# Patient Record
Sex: Male | Born: 1990 | Race: White | Hispanic: No | Marital: Married | Smoking: Former smoker
Health system: Southern US, Academic
[De-identification: ages and names within clinical notes are randomized; demographics above are authoritative.]

## PROBLEM LIST (undated history)

## (undated) DIAGNOSIS — Z9989 Dependence on other enabling machines and devices: Secondary | ICD-10-CM

## (undated) DIAGNOSIS — E119 Type 2 diabetes mellitus without complications: Secondary | ICD-10-CM

## (undated) DIAGNOSIS — Z973 Presence of spectacles and contact lenses: Secondary | ICD-10-CM

## (undated) DIAGNOSIS — G4730 Sleep apnea, unspecified: Secondary | ICD-10-CM

## (undated) HISTORY — PX: HX WISDOM TEETH EXTRACTION: SHX21

---

## 2015-01-12 ENCOUNTER — Other Ambulatory Visit (HOSPITAL_COMMUNITY): Payer: Self-pay

## 2015-01-12 DIAGNOSIS — R29818 Other symptoms and signs involving the nervous system: Secondary | ICD-10-CM

## 2015-01-14 ENCOUNTER — Telehealth (HOSPITAL_BASED_OUTPATIENT_CLINIC_OR_DEPARTMENT_OTHER): Payer: Self-pay

## 2015-01-17 ENCOUNTER — Ambulatory Visit: Payer: Non-veteran care

## 2015-01-17 DIAGNOSIS — G4730 Sleep apnea, unspecified: Secondary | ICD-10-CM | POA: Insufficient documentation

## 2015-01-17 DIAGNOSIS — R29818 Other symptoms and signs involving the nervous system: Secondary | ICD-10-CM

## 2015-01-17 DIAGNOSIS — G4733 Obstructive sleep apnea (adult) (pediatric): Secondary | ICD-10-CM

## 2015-01-18 NOTE — Progress Notes (Signed)
Sleep study completed.

## 2015-01-24 NOTE — Procedures (Signed)
[facility]                                         Sleep Laboratory                                           [address]                                    [place], [place] [address]                                          [phone]                                       PATIENT NAME:    Eddie Wiggins, Eddie Wiggins                   HOSPITAL NUMBER: [id number]  DATE OF BIRTH:   01-12-1991    DATE OF SERVICE: 01/17/2015      January 21, 2015    Damian Leavell, NP  [address]  [place], [place] [address]    Dear [name]:      On the evening of January 17, 2015, Eddie Wiggins reported the [facility] to undergo an all-night polysomnographic study.  He is a 24 year old male who is being evaluated for severe snoring, teeth grinding, excessive daytime sleepiness, recurrent awakenings and frequently awakening with dry mouth.  His past medical history is significant for chronic low back pain.  He is currently on naproxen and methocarbamol.  His usual bedtime is anywhere between 11:00 p.m. and 1:00 a.m. and it takes him 15-45 minutes to fall asleep.  Once asleep, he will wake up 2-3 times during the night.  He gets out of bed at 10:00 a.m.  In general, he will take 2-3 naps during the day.  He had a score of 16 on the Epworth scale which suggests he does have clinically significant sleepiness.    TECHNIQUE:  This polysomnography consisted of the following recorded channels:  Frontal, central and occipital EEG, chin EMG, ROC and LOC, right and left anterior tibial leads, EKG, snoring microphone, nasal/oral airflow, thoracic and abdominal effort via RIP belt technology, oxygen saturation and body position.  This study was scored using the 4% desaturation criteria for hypopnea.    STUDY RESULTS:  On the evening of his all-night polysomnographic study, Eddie Wiggins was provided with a total sleep opportunity of 393 minutes, of which he  actually slept 316 minutes.  Sleep efficiency was 80.4%.  Sleep latency was 39 minutes and REM latency was 116 minutes after sleep onset.  Once asleep, he spent 9.5% of the night awake, 0% stage N1, 68.7% in stage N2, 16.1% in stage N3 and 15.2% in REM sleep.  There were 0 apneas and 40 hypopneas recorded.  His apnea-hypopnea index for the evening as a whole was 7.6 events per hour.  His REM AHI was 18.8 events per hour  and his supine AHI was 10.3 events per hour.  Lowest oxygen saturation with a respiratory event was to 86%.  There were 4 myoclonic events observed that had a periodic quality to their occurrence.  He had a normal periodic limb movement index of 0.8 events per hour.  In the EKG tracing, maximum heart rate while asleep was 100 beats per minute and minimum heart rate was 58 beats per minute.  EKG was normal sinus rhythm.  Some brief sinus tachycardia was noted.  There were no significant EEG abnormalities documented.  No parasomnias or abnormal behaviors were observed.  Severe snoring was present throughout the study.    CLINICAL INTERPRETATION:  The results of Eddie Wiggins's all-night polysomnographic study demonstrates he does have a problem with sleep-disordered breathing.  His apnea-hypopnea index for the evening as a whole was elevated at 7.6 events per hour, consistent with mild obstructive sleep apnea.  No other sleep disrupters were identified.  Given the severity of his symptoms, I am going to recommend he undergo a trial of positive airway pressure therapy.  I will order him a therapeutic sleep study and have him follow up through the [facility] once he has been established on treatment.  I hope you find this information helpful.    Thank you for referring Eddie Wiggins to the [facility] and please do not hesitate to contact me if you have any questions or comments regarding his evaluation.    Sincerely,        Eddie Drumobert C Markez Dowland, MD  Assistant Professor and  Medical Director  [facility]  Section of Pulmonary, Critical Care and Sleep Medicine, [facility] Department of Medicine    [id number]; D: 01/21/2015 17:45:09; T: 01/23/2015 23:05:41    cc: Damian LeavellJoseph Sapp NP      [address]       [place], [place] [id number]

## 2015-01-25 ENCOUNTER — Other Ambulatory Visit (HOSPITAL_COMMUNITY): Payer: Self-pay

## 2015-01-25 DIAGNOSIS — R0683 Snoring: Secondary | ICD-10-CM

## 2015-01-25 DIAGNOSIS — G4733 Obstructive sleep apnea (adult) (pediatric): Secondary | ICD-10-CM

## 2015-02-01 ENCOUNTER — Telehealth (HOSPITAL_COMMUNITY): Payer: Self-pay

## 2015-02-13 ENCOUNTER — Encounter (HOSPITAL_BASED_OUTPATIENT_CLINIC_OR_DEPARTMENT_OTHER): Payer: Non-veteran care

## 2015-02-14 ENCOUNTER — Encounter (HOSPITAL_BASED_OUTPATIENT_CLINIC_OR_DEPARTMENT_OTHER): Payer: Non-veteran care

## 2015-03-02 ENCOUNTER — Other Ambulatory Visit (HOSPITAL_COMMUNITY): Payer: Self-pay

## 2015-03-02 DIAGNOSIS — M25531 Pain in right wrist: Principal | ICD-10-CM

## 2015-03-02 DIAGNOSIS — M25532 Pain in left wrist: Secondary | ICD-10-CM

## 2015-04-04 ENCOUNTER — Encounter (HOSPITAL_COMMUNITY): Payer: Self-pay

## 2015-05-25 ENCOUNTER — Telehealth (HOSPITAL_BASED_OUTPATIENT_CLINIC_OR_DEPARTMENT_OTHER): Payer: Self-pay

## 2015-05-26 ENCOUNTER — Encounter (HOSPITAL_BASED_OUTPATIENT_CLINIC_OR_DEPARTMENT_OTHER): Payer: Non-veteran care

## 2015-09-08 ENCOUNTER — Other Ambulatory Visit (HOSPITAL_BASED_OUTPATIENT_CLINIC_OR_DEPARTMENT_OTHER): Payer: Self-pay

## 2015-09-08 DIAGNOSIS — G4733 Obstructive sleep apnea (adult) (pediatric): Secondary | ICD-10-CM

## 2015-09-08 DIAGNOSIS — R0683 Snoring: Secondary | ICD-10-CM

## 2015-09-20 ENCOUNTER — Ambulatory Visit
Admission: RE | Admit: 2015-09-20 | Discharge: 2015-09-20 | Disposition: A | Discharge status: Home / Self Care | Payer: Non-veteran care | Source: Ambulatory Visit

## 2015-09-20 DIAGNOSIS — G4733 Obstructive sleep apnea (adult) (pediatric): Principal | ICD-10-CM | POA: Insufficient documentation

## 2015-09-20 DIAGNOSIS — R0683 Snoring: Secondary | ICD-10-CM | POA: Insufficient documentation

## 2015-10-03 NOTE — Procedures (Signed)
NAMEKELLIE, Eddie Wiggins   Wiggins NUMBER:  [id number]  DATE OF SERVICE:  09/20/2015  DOB:  13-Mar-1990  SEX:  M      September 30, 2015     Damian Leavell, [address] [place], [place] [address]   Dear [name]:     On the evening of September 20, 2015, Eddie Wiggins returned to the [facility] to undergo a therapeutic sleep study.  We had originally studied him January 17, 2015, and the results of this all-night polysomnographic study demonstrated he does have a problem with sleep-disordered breathing.  His apnea-hypopnea index for the evening as a whole was elevated at 7.6 events per hour consistent with mild obstructive sleep apnea.  No other sleep disrupters were identified.  Given the severity of his symptoms, I recommended he undergo a trial of positive airway pressure therapy.  He is now returning for a therapeutic sleep study.     Technique:  This polysomnography consisted of the following recorded channels: Frontal, central, and occipital EEG, chin EMG, ROC and LOC, right and left anterior tibial leads, EKG, snoring microphone, nasal/oral airflow, thoracic and abdominal effort via RIP belt technology, oxygen saturation, and body position.  This study was scored using the 4% desaturation criteria for hypopnea.     Study Results:  On the evening of his therapeutic sleep study, Eddie Wiggins  was provided with a total sleep opportunity of 424 minutes of which he actually slept 273 minutes.  Sleep efficiency was 64.4%, sleep latency was 139 minutes, and REM latency was 67 minutes after sleep onset.  Once asleep, he spent 4% of the night awake, 2.8% in stage N1, 73.3% in stage N2, 0% in stage N3, and 19.9% in REM sleep.  There was 1 central apnea and 38 hypopneas recorded.  The apnea-hypopnea index for the evening as a whole was 8.6 events per hour.  The supine AHI was 9.1 events per hour and the REM AHI was 4.3 events per hour.  He was initiated on CPAP therapy but failed CPAP  due to difficulty breathing out against continuous pressure.  He was then initiated on BiPAP therapy and titrated to a final pressure of 13/9 cm.  We studied him on this pressure for 93 minutes and he slept for 90 minutes.  21.1% of the time was spent in REM sleep.  His residual AHI on this pressure was 4 events per hour.  Significant hypoxemia was present.  There were 15 myoclonic events observed that had a periodic quality to their occurrence.  He had a normal periodic limb movement index of 11 events per hour.  In the EKG tracing maximum heart rate while asleep was 106 beats per minute and minimum heart rate was 61 beats per minute.  EKG was normal sinus rhythm.  Some brief sinus tachycardia was noted.  There were no significant EEG abnormalities documented.  No parasomnias or abnormal behaviors were observed.      Clinical interpretation the results of their for Eddie Wiggins therapeutic sleep study demonstrates BiPAP at 13/9 cm is effective in controlling his obstructive sleep apnea.  He reported tolerating this therapy quite well and did feel more rested the morning following his therapeutic trial.  No other sleep structures were identified.  I would recommend he be prescribed BiPAP at 13/9 cm with heated humidity and a medium N20 nasal mask.  He should be followed up in 6-8 weeks to assess his response to this therapy.  I hope  you find this information helpful.      Thank you for referring Eddie Wiggins to the [facility] and please do not hesitate to contact me if you have any questions or comments regarding his evaluation.     Sincerely,        Clovis Pu, MD  Assistant Professor and Medical Director, [facility], Section of Pulmonary, Critical Care and Sleep Medicine   [place] Department of Medicine        CC:   Damian Leavell, NP   [address]   [place], [place] [address]       DD:  09/30/2015 15:28:56  DT:  10/03/2015 04:50:09 KG  D#:  [id number]

## 2015-10-19 ENCOUNTER — Other Ambulatory Visit (HOSPITAL_COMMUNITY): Payer: Self-pay | Admitting: PEDIATRIC MEDICINE

## 2015-10-19 DIAGNOSIS — G4733 Obstructive sleep apnea (adult) (pediatric): Secondary | ICD-10-CM

## 2015-10-19 DIAGNOSIS — J351 Hypertrophy of tonsils: Secondary | ICD-10-CM

## 2016-07-29 ENCOUNTER — Emergency Department (HOSPITAL_COMMUNITY): Payer: Self-pay

## 2018-03-24 ENCOUNTER — Inpatient Hospital Stay (HOSPITAL_COMMUNITY)
Admission: EM | Admit: 2018-03-24 | Discharge: 2018-03-24 | Disposition: A | Discharge status: Home / Self Care | Payer: Managed Care, Other (non HMO) | Source: Other Acute Inpatient Hospital

## 2018-03-24 DIAGNOSIS — R509 Fever, unspecified: Secondary | ICD-10-CM

## 2018-03-24 DIAGNOSIS — J101 Influenza due to other identified influenza virus with other respiratory manifestations: Secondary | ICD-10-CM

## 2018-10-27 ENCOUNTER — Other Ambulatory Visit (INDEPENDENT_AMBULATORY_CARE_PROVIDER_SITE_OTHER): Payer: Self-pay

## 2018-10-29 ENCOUNTER — Ambulatory Visit (INDEPENDENT_AMBULATORY_CARE_PROVIDER_SITE_OTHER): Payer: Self-pay | Admitting: Surgery

## 2020-01-13 ENCOUNTER — Emergency Department (HOSPITAL_COMMUNITY): Payer: Managed Care, Other (non HMO)

## 2020-01-13 ENCOUNTER — Other Ambulatory Visit: Payer: Self-pay

## 2020-01-13 ENCOUNTER — Emergency Department
Admission: EM | Admit: 2020-01-13 | Discharge: 2020-01-13 | Disposition: A | Discharge status: Home / Self Care | Payer: Managed Care, Other (non HMO) | Attending: Emergency Medicine | Admitting: Emergency Medicine

## 2020-01-13 ENCOUNTER — Encounter (HOSPITAL_COMMUNITY): Payer: Self-pay

## 2020-01-13 DIAGNOSIS — R5383 Other fatigue: Secondary | ICD-10-CM | POA: Insufficient documentation

## 2020-01-13 DIAGNOSIS — R112 Nausea with vomiting, unspecified: Secondary | ICD-10-CM | POA: Insufficient documentation

## 2020-01-13 DIAGNOSIS — R509 Fever, unspecified: Secondary | ICD-10-CM | POA: Insufficient documentation

## 2020-01-13 DIAGNOSIS — Z20822 Contact with and (suspected) exposure to covid-19: Secondary | ICD-10-CM | POA: Insufficient documentation

## 2020-01-13 DIAGNOSIS — R197 Diarrhea, unspecified: Secondary | ICD-10-CM

## 2020-01-13 DIAGNOSIS — K760 Fatty (change of) liver, not elsewhere classified: Secondary | ICD-10-CM | POA: Insufficient documentation

## 2020-01-13 DIAGNOSIS — R531 Weakness: Secondary | ICD-10-CM | POA: Insufficient documentation

## 2020-01-13 DIAGNOSIS — R109 Unspecified abdominal pain: Secondary | ICD-10-CM

## 2020-01-13 HISTORY — DX: Type 2 diabetes mellitus without complications (CMS HCC): E11.9

## 2020-01-13 HISTORY — DX: Presence of spectacles and contact lenses: Z97.3

## 2020-01-13 LAB — COMPREHENSIVE METABOLIC PANEL, NON-FASTING
ALBUMIN/GLOBULIN RATIO: 1.9 (ref >=1.0)
ALBUMIN: 4.7 g/dL (ref 3.5–5.2)
ALKALINE PHOSPHATASE: 83 U/L (ref 41–133)
ALT (SGPT): 35 U/L (ref 0–55)
ANION GAP: 10 mmol/L (ref 6–15)
AST (SGOT): 14 U/L (ref 5–34)
BILIRUBIN TOTAL: 1.6 mg/dL — ABNORMAL HIGH (ref 0.2–1.2)
BUN: 15 mg/dL (ref 7–21)
CALCIUM: 9.5 mg/dL (ref 8.0–10.6)
CHLORIDE: 97 mmol/L — ABNORMAL LOW (ref 98–107)
CO2 TOTAL: 29 mmol/L (ref 21–32)
CREATININE: 1.01 mg/dL (ref 0.80–1.60)
ESTIMATED GFR: >60 mL/min/{1.73_m2}
GLUCOSE: 202 mg/dL — ABNORMAL HIGH (ref 70–100)
POTASSIUM: 3.8 mmol/L (ref 3.3–5.1)
PROTEIN TOTAL: 7.2 g/dL (ref 6.4–8.3)
SODIUM: 136 mmol/L (ref 136–146)

## 2020-01-13 LAB — URINALYSIS, MICROSCOPIC: BACTERIA: NEGATIVE /hpf

## 2020-01-13 LAB — CBC WITH DIFF
BASOPHIL #: <0.1 10*3/uL (ref <=0.20)
BASOPHIL %: 0 %
EOSINOPHIL #: 0.27 10*3/uL (ref <=0.50)
EOSINOPHIL %: 2 %
HCT: 53 % — ABNORMAL HIGH (ref 38.9–52.0)
HGB: 18.6 g/dL — ABNORMAL HIGH (ref 13.4–17.5)
IMMATURE GRANULOCYTE #: <0.1 10*3/uL (ref <0.10)
IMMATURE GRANULOCYTE %: 0 % (ref 0–1)
LYMPHOCYTE #: 2.03 10*3/uL (ref 1.00–4.80)
LYMPHOCYTE %: 13 %
MCH: 29.9 pg (ref 26.0–32.0)
MCHC: 35.1 g/dL (ref 31.0–35.5)
MCV: 85.1 fL (ref 78.0–100.0)
MONOCYTE #: 1.12 10*3/uL — ABNORMAL HIGH (ref 0.20–1.10)
MONOCYTE %: 7 %
MPV: 9.8 fL (ref 8.7–12.5)
NEUTROPHIL #: 12.25 10*3/uL — ABNORMAL HIGH (ref 1.50–7.70)
NEUTROPHIL %: 78 %
PLATELETS: 305 10*3/uL (ref 150–400)
RBC: 6.23 10*6/uL — ABNORMAL HIGH (ref 4.50–6.10)
RDW-CV: 12.3 % (ref 11.5–15.5)
WBC: 15.8 10*3/uL — ABNORMAL HIGH (ref 3.7–11.0)

## 2020-01-13 LAB — URINALYSIS, MACRO/MICRO
BILIRUBIN: NEGATIVE mg/dL
BLOOD: NEGATIVE mg/dL
GLUCOSE: 500 mg/dL — AB
LEUKOCYTES: NEGATIVE WBCs/uL
NITRITE: NEGATIVE
PH: 5.5 (ref 5.0–9.0)
SPECIFIC GRAVITY: 1.032 — ABNORMAL HIGH (ref 1.001–1.030)
UROBILINOGEN: 0.2 mg/dL (ref 0.2–1.0)

## 2020-01-13 LAB — COVID-19 ~~LOC~~ MOLECULAR LAB TESTING
INFLUENZA VIRUS TYPE A: NOT DETECTED
INFLUENZA VIRUS TYPE B: NOT DETECTED
SARS-COV-2: NOT DETECTED

## 2020-01-13 LAB — LIPASE: LIPASE: 83 U/L — ABNORMAL HIGH (ref 8–78)

## 2020-01-13 LAB — POC BLOOD GLUCOSE (RESULTS)
GLUCOSE, POC: 230 mg/dL — ABNORMAL HIGH (ref 70–100)
GLUCOSE, POC: 157 mg/dL — ABNORMAL HIGH (ref 70–100)

## 2020-01-13 LAB — RED TOP TUBE

## 2020-01-13 LAB — D-DIMER: D-DIMER: 0.25 mg{FEU}/L (ref <0.50)

## 2020-01-13 LAB — LAVENDER TOP TUBE

## 2020-01-13 LAB — LIGHT GREEN TOP TUBE

## 2020-01-13 LAB — MAGNESIUM: MAGNESIUM: 1.8 mg/dL (ref 1.5–2.5)

## 2020-01-13 MED ORDER — ONDANSETRON HCL (PF) 4 MG/2 ML INJECTION SOLUTION
4.0000 mg | INTRAMUSCULAR | Status: AC
Start: 2020-01-13 — End: 2020-01-13
  Administered 2020-01-13: 4 mg via INTRAVENOUS
  Filled 2020-01-13: qty 2

## 2020-01-13 MED ORDER — PROMETHAZINE 25 MG TABLET
25.0000 mg | ORAL_TABLET | Freq: Three times a day (TID) | ORAL | 0 refills | Status: AC | PRN
Start: 2020-01-13 — End: 2020-01-20

## 2020-01-13 MED ORDER — SODIUM CHLORIDE 0.9 % IV BOLUS
1000.0000 mL | INJECTION | Freq: Once | Status: AC
Start: 2020-01-13 — End: 2020-01-13
  Administered 2020-01-13: 0 mL via INTRAVENOUS
  Administered 2020-01-13: 13:00:00 1000 mL via INTRAVENOUS

## 2020-01-13 MED ORDER — METRONIDAZOLE 500 MG TABLET
500.0000 mg | ORAL_TABLET | Freq: Three times a day (TID) | ORAL | 0 refills | Status: AC
Start: 2020-01-13 — End: 2020-01-20

## 2020-01-13 NOTE — ED Triage Notes (Addendum)
SINCE YESTERDAY, FEVER, N/V. ABD PAIN. CHILLS. HAS DIABETES, ACCUCHECK IN TRIAGE IS 230

## 2020-01-13 NOTE — ED Nurses Note (Signed)
CALLED TO TRIAGE, WAS TOLD HAS BEEN IN BR SINCE FAMILY CHECKED HIM IN

## 2020-01-13 NOTE — ED Nurses Note (Signed)
Pt VS repeated and pt is stable at this time.

## 2020-01-13 NOTE — ED Provider Notes (Signed)
[place] - Emergency Department      Attending Physician: Dr. Hetty Blend  CC:  Chief Complaint   Patient presents with   . Fever       HPI:  Eddie Wiggins is a 29 y.o. male who presents to the Emergency Room with c/o being sick for 2 days.  Patient said that the he has nausea vomiting and diarrhea since yesterday and abdominal pain and cramping.  He said he has also running fever.  He is also complaining weakness and tiredness.  He has no chest pain or shortness of breath.  He does not have any COVID vaccination shot.    Review of Systems:  Constitutional:  Constitutional symptoms negative except what describe in history.  Skin:  Negative except what Idescribed in his history.  HENT:Negative except what I described in his history.  Eyes: Negative except what a described in his history.  Cardio: Negative except what I described in his history.  Respiratory:Negative except what I described in his history.  GI: Negative except what I a described in his history.  GU: Negative except what I described in his history.  MSK: Negative except what I described in his history.  Neuro: Negative except what I described in his history.   All other systems reviewed and are negative or as noted in HPI.    History:  PMH:    Past Medical History:   Diagnosis Date   . Diabetes mellitus, type 2 (CMS HCC)    . Wears glasses          Previous Medications    No medications on file       PSH:        Social Hx:    Social History     Socioeconomic History   . Marital status: Unknown     Spouse name: Not on file   . Number of children: Not on file   . Years of education: Not on file   . Highest education level: Not on file   Occupational History   . Not on file   Tobacco Use   . Smoking status: Never Smoker   Substance and Sexual Activity   . Alcohol use: Not Currently   . Drug use: Never   . Sexual activity: Not on file   Other Topics Concern   . Not on file   Social History Narrative   . Not on file     Social Determinants of Health      Financial Resource Strain:    . Difficulty of Paying Living Expenses:    Food Insecurity:    . Worried About Programme researcher, broadcasting/film/video in the Last Year:    . Barista in the Last Year:    Transportation Needs:    . Freight forwarder (Medical):    Marland Kitchen Lack of Transportation (Non-Medical):    Physical Activity:    . Days of Exercise per Week:    . Minutes of Exercise per Session:    Stress:    . Feeling of Stress :    Intimate Partner Violence:    . Fear of Current or Ex-Partner:    . Emotionally Abused:    Marland Kitchen Physically Abused:    . Sexually Abused:      Family Hx: No family history on file.  Allergies: No Known Allergies    Above history reviewed with patient, changes are as documented.    Physical Exam:   Nursing note  and vitals reviewed.  ED Triage Vitals [01/13/20 0951]   BP (Non-Invasive) 116/76   Heart Rate 100   Respiratory Rate 20   Temperature 37.5 C (99.5 F)   SpO2 97 %   Weight 86.2 kg (190 lb)   Height 1.778 m (5\' 10" )       Constitutional: Pt is alert and oriented and appears well-developed and well-nourished. No acute distress.   Head.  Atraumatic.  Pupils.  Equal and reacting to light.  HENT:   Mouth/Throat: Oropharynx is clear and moist.  Tonsils not enlarged no inflammation.  Eyes: Conjunctivae and extraocular motions are normal. Pupils are equal, round, and reactive to light.   Neck: Normal range of motion. Neck supple.  No JVDs or bruits trachea midline  Cardiovascular:  Heart S1-S2 regular no gallop or murmur.  Normal rate, regular rhythm and intact distal pulses.    Pulmonary/Chest: Effort normal. No respiratory distress. Breath sounds normal.  Lungs are clear no rales rhonchi or wheezing.  Abdominal: Abdomen is soft, nontender, nondistended.  No rebound or guarding noted.  Bowel sounds are present.  Liver spleen and kidneys are not palpable.  Musculoskeletal: Normal range of motion.  Examination of lower extremities shows no edema.  Neurological: Pt is alert and oriented. Grossly  intact. Moving all extremities. Facies symmetric.  No motor or sensory deficits.  Skin: Skin is warm and dry without rash.  Color normal.      Course:   Impression: Pt presenting c/o   Chief Complaint   Patient presents with   . Fever       Plan:Will obtain the following labs/imaging and give patient the following medications to alleviate symptoms:  Orders Placed This Encounter   . ADULT ROUTINE BLOOD CULTURE, SET OF 2 BOTTLES (BACTERIA AND YEAST)   . ADULT ROUTINE BLOOD CULTURE, SET OF 2 BOTTLES (BACTERIA AND YEAST)   . XR AP MOBILE CHEST   . CT ABDOMEN PELVIS WO IV CONTRAST   . URINALYSIS WITH REFLEX MICROSCOPIC AND CULTURE IF POSITIVE   . URINALYSIS, MACRO/MICRO   . CBC/DIFF   . COMPREHENSIVE METABOLIC PANEL, NON-FASTING   . MAGNESIUM   . D-DIMER   . CANCELED: URINALYSIS WITH MICROSCOPIC REFLEX IF INDICATED   . COVID-19 - SCREENING - Admission (NON-PUI)   . CBC WITH DIFF   . CANCELED: URINALYSIS, MACRO/MICRO   . LIPASE   . EXTRA TUBES   . RED TOP TUBE   . LIGHT GREEN TOP TUBE   . LAVENDER TOP TUBE   . URINALYSIS, MICROSCOPIC   . NS bolus infusion 1,000 mL   . ondansetron (ZOFRAN) 2 mg/mL injection   . metroNIDAZOLE (FLAGYL) 500 mg Oral Tablet   . promethazine (PHENERGAN) 25 mg Oral Tablet     Laboratory Results:    Results for orders placed or performed during the hospital encounter of 01/13/20 (from the past 12 hour(s))   POC BLOOD GLUCOSE (RESULTS)   Result Value Ref Range    GLUCOSE, POC 230 (H) 70 - 100 mg/dl   COMPREHENSIVE METABOLIC PANEL, NON-FASTING   Result Value Ref Range    SODIUM 136 136 - 146 mmol/L    POTASSIUM 3.8 3.3 - 5.1 mmol/L    CHLORIDE 97 (L) 98 - 107 mmol/L    CO2 TOTAL 29 21 - 32 mmol/L    ANION GAP 10 6 - 15 mmol/L    BUN 15 7 - 21 mg/dL    CREATININE 1.611.01 0.960.80 - 1.60 mg/dL  ESTIMATED GFR >60 mL/min/1.54m^2    ALBUMIN 4.7 3.5 - 5.2 g/dL    CALCIUM 9.5 8.0 - 27.7 mg/dL    GLUCOSE 824 (H) 70 - 100 mg/dL    ALKALINE PHOSPHATASE 83 41 - 133 U/L    ALT (SGPT) 35 0 - 55 U/L    AST (SGOT) 14 5  - 34 U/L    BILIRUBIN TOTAL 1.6 (H) 0.2 - 1.2 mg/dL    PROTEIN TOTAL 7.2 6.4 - 8.3 g/dL    ALBUMIN/GLOBULIN RATIO 1.9 >=1.0   MAGNESIUM   Result Value Ref Range    MAGNESIUM 1.8 1.5 - 2.5 mg/dL   D-DIMER   Result Value Ref Range    D-DIMER 0.25 <0.50 mg/L FEU   COVID-19 - SCREENING - Admission (NON-PUI)   Result Value Ref Range    SARS-COV-2 Not Detected Not Detected    INFLUENZA VIRUS TYPE A Not Detected Not Detected    INFLUENZA VIRUS TYPE B Not Detected Not Detected   CBC WITH DIFF   Result Value Ref Range    WBC 15.8 (H) 3.7 - 11.0 x10^3/uL    RBC 6.23 (H) 4.50 - 6.10 x10^6/uL    HGB 18.6 (H) 13.4 - 17.5 g/dL    HCT 23.5 (H) 36.1 - 52.0 %    MCV 85.1 78.0 - 100.0 fL    MCH 29.9 26.0 - 32.0 pg    MCHC 35.1 31.0 - 35.5 g/dL    RDW-CV 44.3 15.4 - 00.8 %    PLATELETS 305 150 - 400 x10^3/uL    MPV 9.8 8.7 - 12.5 fL    NEUTROPHIL % 78 %    LYMPHOCYTE % 13 %    MONOCYTE % 7 %    EOSINOPHIL % 2 %    BASOPHIL % 0 %    NEUTROPHIL # 12.25 (H) 1.50 - 7.70 x10^3/uL    LYMPHOCYTE # 2.03 1.00 - 4.80 x10^3/uL    MONOCYTE # 1.12 (H) 0.20 - 1.10 x10^3/uL    EOSINOPHIL # 0.27 <=0.50 x10^3/uL    BASOPHIL # <0.10 <=0.20 x10^3/uL    IMMATURE GRANULOCYTE % 0 0 - 1 %    IMMATURE GRANULOCYTE # <0.10 <0.10 x10^3/uL   LIPASE   Result Value Ref Range    LIPASE 83 (H) 8 - 78 U/L   URINALYSIS, MACRO/MICRO   Result Value Ref Range    COLOR Yellow Yellow    APPEARANCE Clear Clear    SPECIFIC GRAVITY 1.032 (H) 1.001 - 1.030    PH 5.5 5.0 - 9.0    PROTEIN Trace (A) Negative mg/dL    GLUCOSE 676  (A) Negative mg/dL    KETONES Trace (A) Negative mg/dL    UROBILINOGEN 0.2  0.2 - 1.0 mg/dL    BILIRUBIN Negative Negative mg/dL    BLOOD Negative Negative mg/dL    NITRITE Negative Negative    LEUKOCYTES Negative Negative WBCs/uL   URINALYSIS, MICROSCOPIC   Result Value Ref Range    SQUAMOUS EPITHELIAL Rare (A) None /hpf    WBCS 0-1 (A) None /hpf    RBCS 0-2 (A) None /hpf    BACTERIA Negative Negative /hpf    HYALINE CASTS 1-3 (A) None /lpf   POC BLOOD  GLUCOSE (RESULTS)   Result Value Ref Range    GLUCOSE, POC 157 (H) 70 - 100 mg/dl        Radiographical Imaging:   Results for orders placed or performed during the hospital encounter of 01/13/20   CT  ABDOMEN PELVIS WO IV CONTRAST     Status: None    Narrative    INDICATION:  Abdominal pain with vomiting and diarrhea.    TECHNIQUE:  CT of the abdomen and pelvis was performed without intravenous contrast.      Automated mA/kV exposure control was utilized and patient examination was performed in strict accordance with principles of ALARA.    RADIATION AMOUNT:  374.70 mGy-cm.    COMPARISON:  None Available.    FINDINGS:   Abdomen:  The lung bases are clear.  The liver, spleen, pancreas, adrenal glands, and kidneys are suboptimally evaluated on these unenhanced images, but demonstrate no acute pathology.  Moderate fatty infiltration of the liver.  Gallbladder is present without gallstones.  No renal/ureteral calculi.    There is no free air or lymph node enlargement.  Abdominal aorta is not aneurysmal.    Pelvis:  There is no bowel wall thickening or obstruction.  Appendix is normal.  There is no free fluid.  Lymph nodes are not enlarged.  Urinary bladder is unremarkable.    Skeleton:    There are no acute fractures.  No suspicious bony lesions.      Impression    No acute process.    Moderate hepatic steatosis.      Signed by Darrol Poke, MD   XR AP MOBILE CHEST     Status: None    Narrative    INDICATION:  Cough and fever.    TECHNIQUE: AP portable chest was obtained at 1340 hours.    COMPARISON:  03/24/2018 XR Chest    FINDINGS:    The cardiomediastinal silhouette is normal in size.  There is no consolidation or atelectasis in either lung.  There are no pleural effusions.  There is no pneumothorax.  No acute osseous process.        Impression    Normal portable chest.      Signed by Theodoro Clock, MD       EKG- No results found for this visit on 01/13/20 (from the past 720 hour(s)).      All labs were reviewed.  Medical Records reviewed.     MDM/Course:  Visit Vitals  Vitals:    01/13/20 0951 01/13/20 1125 01/13/20 1300   BP: 116/76 116/81 117/79   Pulse: 100 (!) 113 (!) 102   Resp: 20 20 16    Temp: 37.5 C (99.5 F) 36.6 C (97.9 F)    SpO2: 97% 92% 96%   Weight: 86.2 kg (190 lb)     Height: 1.778 m (5\' 10" )     BMI: 27.32                Disposition: Discharged    Clinical Impression:   Encounter Diagnosis   Name Primary?   . Abdominal pain, vomiting, and diarrhea Yes     Follow Up: [facility]  [address]  [place] [address] [place]  [phone]    In 3 days  As needed    Medications Prescribed:   New Prescriptions    METRONIDAZOLE (FLAGYL) 500 MG ORAL TABLET    Take 1 Tablet (500 mg total) by mouth Three times a day for 7 days    PROMETHAZINE (PHENERGAN) 25 MG ORAL TABLET    Take 1 Tablet (25 mg total) by mouth Every 8 hours as needed for Nausea/Vomiting for up to 7 days   Plan and treatment.  Patient was given IV fluids.  He  was given Zofran 4 mg IV and 500 mg of Flagyl p.o..  Is feeling better I discussed results with him and he was discharged home on Flagyl and promethazine.  Plenty of liquids.  Follow-up your doctor as needed.  Return if symptoms get worse.      Emeline Gins, MD 01/13/2020, 17:19

## 2020-01-18 LAB — ADULT ROUTINE BLOOD CULTURE, SET OF 2 BOTTLES (BACTERIA AND YEAST)
BLOOD CULTURE, ROUTINE: NO GROWTH
BLOOD CULTURE, ROUTINE: NO GROWTH

## 2021-03-07 ENCOUNTER — Other Ambulatory Visit: Payer: Self-pay

## 2021-03-07 ENCOUNTER — Encounter (HOSPITAL_COMMUNITY): Payer: Self-pay

## 2021-03-07 DIAGNOSIS — J111 Influenza due to unidentified influenza virus with other respiratory manifestations: Secondary | ICD-10-CM

## 2021-03-07 NOTE — ED Triage Notes (Signed)
Fever at home. Tested positive for flu A today. Has taken motrin and tylenol

## 2021-03-08 ENCOUNTER — Emergency Department (HOSPITAL_COMMUNITY): Payer: Managed Care, Other (non HMO)

## 2021-03-08 ENCOUNTER — Emergency Department
Admission: EM | Admit: 2021-03-08 | Discharge: 2021-03-08 | Disposition: A | Discharge status: Home / Self Care | Payer: Managed Care, Other (non HMO) | Attending: Medical | Admitting: Medical

## 2021-03-08 DIAGNOSIS — J111 Influenza due to unidentified influenza virus with other respiratory manifestations: Secondary | ICD-10-CM | POA: Insufficient documentation

## 2021-03-08 MED ORDER — ONDANSETRON HCL (PF) 4 MG/2 ML INJECTION SOLUTION
4.0000 mg | INTRAMUSCULAR | Status: AC
Start: 2021-03-08 — End: 2021-03-08
  Administered 2021-03-08: 4 mg via INTRAVENOUS
  Filled 2021-03-08: qty 2

## 2021-03-08 MED ORDER — ACETAMINOPHEN 500 MG TABLET
1000.0000 mg | ORAL_TABLET | ORAL | Status: AC
Start: 2021-03-08 — End: 2021-03-08
  Administered 2021-03-08: 1000 mg via ORAL
  Filled 2021-03-08 (×2): qty 2

## 2021-03-08 MED ORDER — ONDANSETRON 4 MG DISINTEGRATING TABLET
4.0000 mg | ORAL_TABLET | Freq: Three times a day (TID) | ORAL | 0 refills | Status: DC | PRN
Start: 2021-03-08 — End: 2021-05-15

## 2021-03-08 MED ORDER — SODIUM CHLORIDE 0.9 % IV BOLUS
1000.0000 mL | INJECTION | Status: AC
Start: 2021-03-08 — End: 2021-03-08
  Administered 2021-03-08: 0 mL via INTRAVENOUS
  Administered 2021-03-08: 1000 mL via INTRAVENOUS

## 2021-03-08 NOTE — ED Provider Notes (Signed)
[facility]     Attending APP:  Gerhard Munch. Athena Masse  CC:  Chief Complaint   Patient presents with    Fever       HPI Provided by:  Patient    Arrival Mode:  Patient, POV    Eddie Wiggins is a 30 y.o. male who presents to the ED  c/o fever, cough, sore throat, runny nose, body aches and pains.  Diagnosed with Influenza earlier at PCP, couldn't control the fever and wife called PCP who said to go to the ED.  Also complaining of nausea, no v/d or bladder changes.  PCP prescribed Tamiflu and he started it earlier.      ROS:  Other than ROS in HPI, all other systems were negative.      History:  PMH:    Past Medical History:   Diagnosis Date    Diabetes mellitus, type 2 (CMS HCC)     Wears glasses                       Previous Medications    LISINOPRIL (PRINIVIL) 5 MG ORAL TABLET    Take 1 Tablet (5 mg total) by mouth Once a day    METFORMIN (GLUCOPHAGE) 500 MG ORAL TABLET    Take 1 Tablet (500 mg total) by mouth Twice daily with food     PSH:        Social Hx:    Social History     Socioeconomic History    Marital status: Unknown     Spouse name: Not on file    Number of children: Not on file    Years of education: Not on file    Highest education level: Not on file   Occupational History    Not on file   Tobacco Use    Smoking status: Never    Smokeless tobacco: Not on file   Substance and Sexual Activity    Alcohol use: Not Currently    Drug use: Never    Sexual activity: Not on file   Other Topics Concern    Not on file   Social History Narrative    Not on file     Social Determinants of Health     Financial Resource Strain: Not on file   Food Insecurity: Not on file   Transportation Needs: Not on file   Physical Activity: Not on file   Stress: Not on file   Intimate Partner Violence: Not on file   Housing Stability: Not on file     Family Hx: No family history on file.  Allergies: No Known Allergies    Above history reviewed with patient, changes are as documented.    Physical Exam:      ED Triage Vitals [03/07/21 2340]   BP (Non-Invasive) 124/79   Heart Rate (!) 133   Respiratory Rate 18   Temperature (!) 39.6 C (103.3 F)   SpO2 94 %   Weight    Height      Constitutional: Pt is alert and oriented to person, place, and time and appears well-developed and well-nourished. Looks like he doesn't feel well but not toxic.   HENT: Normocephalic, atraumatic, negative sinus tender to palpation.  Mouth/Throat: Oral mucous membranes moist, positive erythema or exudate.  Tonsils midline and gag reflex present. Oropharynx is clear and without drainage.   Eyes: Conjunctivae clear,extraocular motions intact. Pupils are equal, round, and reactive to light. Negative for scleral icterus.  Neck: Normal range of motion. Neck supple.   Cardiovascular: RRR, No murmurs/rubs/gallops.    Pulmonary/Chest: CTA all fields, effort normal. No respiratory distress. Musculoskeletal: Normal range of motion. No deformities noted.    Neurological: No local deficits noted.  CN II-XII intact. Facies symmetric.  Skin: Skin is warm and dry without rash.  Turgor good.  Color normal.    Psych:  Patient with normal mood and affect.    Course:     Impression: Pt presenting c/o   Chief Complaint   Patient presents with    Fever       Plan:    I will obtain the following labs/imaging and give patient the following medications to alleviate symptoms:  Orders Placed This Encounter    XR AP MOBILE CHEST    acetaminophen (TYLENOL) tablet    ondansetron (ZOFRAN) 2 mg/mL injection    NS bolus infusion 1,000 mL    ondansetron (ZOFRAN ODT) 4 mg Oral Tablet, Rapid Dissolve    NS bolus infusion 1,000 mL       Laboratory Results:  Labs Reviewed - No data to display      Radiographical Imaging:        All labs were reviewed.by me.  Medical Records reviewed if available.     MDM/Course:  Fluids, Tylenol, CXR    Differential Diagnosis includes, but is not limited to:  Influenza      MDM:    During the patient's stay in the emergency  department, the above listed imaging and/or labs were performed to assist with medical decision making and were reviewed by myself when available for review.    Patient rechecked and remained stable throughout remainder of emergency department course.    All questions/concerns addressed, and patient agrees with plan of care.     Visit Vitals  Vitals:    03/07/21 2340   BP: 124/79   Pulse: (!) 133   Resp: 18   Temp: (!) 39.6 C (103.3 F)   SpO2: 94%     Consults: None      Disposition: Discharged    Following the above history, physical exam, and studies, the patient was deemed stable and suitable for discharge. Discharge and medication instructions were discussed with the patient/patient's family and all questions were addressed. The patient understands that they may return to the ED at any time for new or worsening symptoms, or if they have any other concerns.    Clinical Impression:   Encounter Diagnosis   Name Primary?    Influenza Yes       Follow Up:   Inc, [facility]  [address]  [place] [address]  [phone]    Call today        Medications Prescribed:   New Prescriptions    ONDANSETRON (ZOFRAN ODT) 4 MG ORAL TABLET, RAPID DISSOLVE    Take 1 Tablet (4 mg total) by mouth Every 8 hours as needed for Nausea/Vomiting       The co-signing faculty was physically present in the emergency department and available for consultation and did not participate in the care of this patient.    // Cala Bradford B. Athena Masse 03/08/2021 00:48  Department of Emergency Medicine [facility] MEDICINE    This chart may have been completed after the conclusion of this patient's care due to the time constraints of simultaneous responsibilities of direct patient care activities during the clinical shift in the emergency department.  This note was partially generated using MModal Fluency Direct system, and there may be some incorrect words, spellings, and punctuation's that  were not noted in checking the note before saving.

## 2021-04-17 ENCOUNTER — Ambulatory Visit: Payer: Managed Care, Other (non HMO) | Attending: Physician Assistant | Admitting: Physician Assistant

## 2021-04-17 ENCOUNTER — Other Ambulatory Visit: Payer: Self-pay

## 2021-04-17 ENCOUNTER — Encounter (INDEPENDENT_AMBULATORY_CARE_PROVIDER_SITE_OTHER): Payer: Self-pay | Admitting: Physician Assistant

## 2021-04-17 VITALS — Resp 18 | Ht 70.0 in | Wt 195.0 lb

## 2021-04-17 DIAGNOSIS — Z3009 Encounter for other general counseling and advice on contraception: Secondary | ICD-10-CM

## 2021-04-17 NOTE — Progress Notes (Signed)
[facility]  [facility]    CC:  Patient seeking elective sterilization    HISTORY: The patient is 31 y.o. male who presents today to discuss a vasectomy.  He has 2 children and desires to have no more.  The patient denies any genitourinary history.  He does not use aspirin.   He denies a history of bleeding diathesis. He denies trouble urinating and trouble with ED. He denies hx of cryptochidism or prior surgery/injury to scrotum.    Past Medical History:   Diagnosis Date   . Diabetes mellitus, type 2 (CMS HCC)    . Wears glasses            Past Surgical History:   Procedure Laterality Date   . HX WISDOM TEETH EXTRACTION             Current Outpatient Medications   Medication Sig   . lisinopriL (PRINIVIL) 5 mg Oral Tablet Take 1 Tablet (5 mg total) by mouth Once a day   . MetFORMIN (GLUCOPHAGE) 1,000 mg Oral Tablet Take 1 Tablet (1,000 mg total) by mouth Twice daily   . metFORMIN (GLUCOPHAGE) 500 mg Oral Tablet Take 1 Tablet (500 mg total) by mouth Twice daily with food   . methocarbamoL (ROBAXIN) 500 mg Oral Tablet TAKE 1 TABLET EVERY DAY AS NEEDED FOR HEADACHES/BACK PAIN   . ondansetron (ZOFRAN ODT) 4 mg Oral Tablet, Rapid Dissolve Take 1 Tablet (4 mg total) by mouth Every 8 hours as needed for Nausea/Vomiting   . sumatriptan succinate (IMITREX) 100 mg Oral Tablet    . topiramate (TOPAMAX) 50 mg Oral Tablet Take 1 Tablet (50 mg total) by mouth Every night   . TRULICITY 1.5 mg/0.5 mL Subcutaneous Pen Injector INJECT 0.5 ML ONCE WEEKLY-- NEEDS APPT FOR FURTHER REFILLS       No Known Allergies    Social History     Socioeconomic History   . Marital status: Unknown     Spouse name: Not on file   . Number of children: Not on file   . Years of education: Not on file   . Highest education level: Not on file   Occupational History   . Not on file   Tobacco Use   . Smoking status: Never   . Smokeless tobacco: Current     Types: Snuff   Vaping Use   . Vaping Use: Never used   Substance and  Sexual Activity   . Alcohol use: Not Currently   . Drug use: Never   . Sexual activity: Not on file   Other Topics Concern   . Not on file   Social History Narrative   . Not on file     Social Determinants of Health     Financial Resource Strain: Not on file   Transportation Needs: Not on file   Social Connections: Not on file   Intimate Partner Violence: Not on file   Housing Stability: Not on file       Family Medical History:     Problem Relation (Age of Onset)    Diabetes Father    Stroke Mother            REVIEW OF SYSTEMS  All 10 systems were reviewed.  Pertinent positives were listed above All other systems are negative.    PHYSICAL EXAMINATION:   Well-developed, well-nourished male in no apparent distress.  Neck is supple, trachea midline  Respiratory examination reveals good inspiratory effort bilaterally  Cardiac examination reveals a regular rate and rhythm  Abdomen is free of masses, tenderness or hepatosplenomegaly.  Penis is normal.  Testes are bilaterally descended and unremarkable.  Vas deferens are difficult to palpate.  Skin is normal  Musculoskeletal and neurological examinations are within normal limits    ASSESSMENT:  Elective Sterilization, patient seeking vasectomy    We reviewed risks, potential complications and benefits of vasectomy were extensively.  Risks include but are not limited to that of bleeding, swelling, ecchymosis, infection, acute and chronic orchalgia.  We discussed postoperative care and restrictions on lifting and sexual activity.  We discussed the need for contraception in the postoperative period until post vasectomy semen analysis is completed.  We discussed the risks of subsequent pregnancy after a negative semen analysis, quoted at approximately 1:2000. After a thorough understanding of risks and benefits, the patient is electing to proceed.      Plan procedure in OR; risks/benefits/alternatives discussed with patient, patient agreeable to procedure    All of the  patient's questions were answered prior to leaving the office today.    Chong Sicilian, PA-C

## 2021-04-26 ENCOUNTER — Telehealth (INDEPENDENT_AMBULATORY_CARE_PROVIDER_SITE_OTHER): Payer: Self-pay | Admitting: Student in an Organized Health Care Education/Training Program

## 2021-04-26 NOTE — Telephone Encounter (Signed)
Pt is aware he is scheduled @ [place] with dr Christoper Fabian for vasectomy 05/25/21 he is aware to have pre op testing done prior to procedure thank you

## 2021-05-08 ENCOUNTER — Other Ambulatory Visit (HOSPITAL_BASED_OUTPATIENT_CLINIC_OR_DEPARTMENT_OTHER): Payer: Managed Care, Other (non HMO)

## 2021-05-08 ENCOUNTER — Other Ambulatory Visit: Payer: Self-pay

## 2021-05-08 ENCOUNTER — Inpatient Hospital Stay (HOSPITAL_BASED_OUTPATIENT_CLINIC_OR_DEPARTMENT_OTHER)
Admission: RE | Admit: 2021-05-08 | Discharge: 2021-05-08 | Disposition: A | Discharge status: Home / Self Care | Payer: Managed Care, Other (non HMO) | Source: Ambulatory Visit | Attending: Physician Assistant | Admitting: Physician Assistant

## 2021-05-08 ENCOUNTER — Inpatient Hospital Stay
Admission: RE | Admit: 2021-05-08 | Discharge: 2021-05-08 | Disposition: A | Discharge status: Home / Self Care | Payer: Managed Care, Other (non HMO) | Source: Ambulatory Visit | Attending: Physician Assistant | Admitting: Physician Assistant

## 2021-05-08 DIAGNOSIS — G43101 Migraine with aura, not intractable, with status migrainosus: Secondary | ICD-10-CM | POA: Insufficient documentation

## 2021-05-08 DIAGNOSIS — E119 Type 2 diabetes mellitus without complications: Secondary | ICD-10-CM | POA: Insufficient documentation

## 2021-05-08 DIAGNOSIS — Z3009 Encounter for other general counseling and advice on contraception: Secondary | ICD-10-CM

## 2021-05-08 LAB — CBC WITH DIFF
BASOPHIL #: <0.1 10*3/uL (ref <=0.20)
BASOPHIL %: 1 %
EOSINOPHIL #: 0.35 10*3/uL (ref <=0.50)
EOSINOPHIL %: 4 %
HCT: 46.9 % (ref 38.9–52.0)
HGB: 16.6 g/dL (ref 13.4–17.5)
IMMATURE GRANULOCYTE #: <0.1 10*3/uL (ref <0.10)
IMMATURE GRANULOCYTE %: 1 % (ref 0–1)
LYMPHOCYTE #: 2.58 10*3/uL (ref 1.00–4.80)
LYMPHOCYTE %: 27 %
MCH: 30.2 pg (ref 26.0–32.0)
MCHC: 35.4 g/dL (ref 31.0–35.5)
MCV: 85.3 fL (ref 78.0–100.0)
MONOCYTE #: 0.75 10*3/uL (ref 0.20–1.10)
MONOCYTE %: 8 %
MPV: 9.9 fL (ref 8.7–12.5)
NEUTROPHIL #: 5.94 10*3/uL (ref 1.50–7.70)
NEUTROPHIL %: 59 %
PLATELETS: 288 10*3/uL (ref 150–400)
RBC: 5.5 10*6/uL (ref 4.50–6.10)
RDW-CV: 12.3 % (ref 11.5–15.5)
WBC: 9.7 10*3/uL (ref 3.7–11.0)

## 2021-05-08 LAB — BASIC METABOLIC PANEL
ANION GAP: 8 mmol/L (ref 6–15)
BUN: 13 mg/dL (ref 7–21)
CALCIUM: 9.2 mg/dL (ref 8.0–10.6)
CHLORIDE: 100 mmol/L (ref 98–107)
CO2 TOTAL: 30 mmol/L (ref 21–32)
CREATININE: 0.94 mg/dL (ref 0.80–1.60)
ESTIMATED GFR: >60 mL/min/{1.73_m2}
GLUCOSE: 273 mg/dL — ABNORMAL HIGH (ref 70–100)
POTASSIUM: 4 mmol/L (ref 3.3–5.1)
SODIUM: 138 mmol/L (ref 136–146)

## 2021-05-08 LAB — URINALYSIS, MACRO/MICRO
BILIRUBIN: NEGATIVE mg/dL
BLOOD: NEGATIVE mg/dL
GLUCOSE: >=1000 mg/dL — AB
KETONES: NEGATIVE mg/dL
LEUKOCYTES: NEGATIVE WBCs/uL
NITRITE: NEGATIVE
PH: 6 (ref 5.0–8.0)
PROTEIN: NEGATIVE mg/dL
SPECIFIC GRAVITY: 1.02 (ref 1.005–?)
UROBILINOGEN: 0.2 mg/dL (ref 0.2–1.0)

## 2021-05-08 LAB — COMPREHENSIVE METABOLIC PNL, FASTING
ALBUMIN/GLOBULIN RATIO: 2 (ref >=1.0)
ALBUMIN: 4.3 g/dL (ref 3.5–5.2)
ALKALINE PHOSPHATASE: 78 U/L (ref 41–133)
ALT (SGPT): 40 U/L (ref 0–55)
ANION GAP: 7 mmol/L (ref 6–15)
AST (SGOT): 21 U/L (ref 5–34)
BILIRUBIN TOTAL: 0.6 mg/dL (ref 0.2–1.2)
BUN: 13 mg/dL (ref 7–21)
CALCIUM: 9.3 mg/dL (ref 8.0–10.6)
CHLORIDE: 100 mmol/L (ref 98–107)
CO2 TOTAL: 31 mmol/L (ref 21–32)
CREATININE: 0.95 mg/dL (ref 0.80–1.60)
ESTIMATED GFR: >60 mL/min/{1.73_m2}
GLUCOSE: 271 mg/dL — ABNORMAL HIGH (ref 70–100)
POTASSIUM: 4.1 mmol/L (ref 3.3–5.1)
PROTEIN TOTAL: 6.4 g/dL (ref 6.4–8.3)
SODIUM: 138 mmol/L (ref 136–146)

## 2021-05-08 LAB — LIPID PANEL
CHOL/HDL RATIO: 7.1
CHOLESTEROL: 198 mg/dL (ref 75–200)
HDL CHOL: 28 mg/dL (ref 27–58)
TRIGLYCERIDES: 505 mg/dL — ABNORMAL HIGH (ref 28–153)

## 2021-05-08 LAB — URINALYSIS, MICROSCOPIC: BACTERIA: NEGATIVE /hpf

## 2021-05-08 LAB — MAGNESIUM: MAGNESIUM: 1.9 mg/dL (ref 1.5–2.5)

## 2021-05-09 DIAGNOSIS — Z3009 Encounter for other general counseling and advice on contraception: Secondary | ICD-10-CM

## 2021-05-09 LAB — ECG 12 LEAD
Atrial Rate: 75 {beats}/min
Calculated P Axis: 17 degrees
Calculated R Axis: -6 degrees
Calculated T Axis: 32 degrees
PR Interval: 120 ms
QRS Duration: 104 ms
QT Interval: 392 ms
QTC Calculation: 437 ms
Ventricular rate: 75 {beats}/min

## 2021-05-09 LAB — URINE CULTURE: URINE CULTURE: 6000 — AB

## 2021-05-09 LAB — HGA1C (HEMOGLOBIN A1C WITH EST AVG GLUCOSE): HEMOGLOBIN A1C: 8.5 % — ABNORMAL HIGH (ref 4.0–6.0)

## 2021-05-15 ENCOUNTER — Encounter (HOSPITAL_COMMUNITY): Payer: Self-pay | Admitting: Student in an Organized Health Care Education/Training Program

## 2021-05-21 NOTE — Care Management Notes (Signed)
Procedure (Vasectomy) will be done by Dr. Aviva Signs on 05/25/2021. Case will be done as an Outpatient/Day Surgery. [employer] coverage is active as of 01/19/2015. CPT Code(s) 55250 is valid. Prior authorization is not required per [employer] Results-see below. Refer to H&P done by [facility].  Precertification list results  CPT codes searched: [phone]  The procedure code you entered was not found on the [facility] Participating Provider Medical Precertification List. If you're a participating provider, no precertification is required when this service is performed as an outpatient procedure for a medical or surgical diagnosis. This procedure code may require precertification for behavioral health diagnoses.  [facility], CASE MANAGER  05/21/2021, 10:43

## 2021-05-25 ENCOUNTER — Encounter (HOSPITAL_COMMUNITY): Payer: Self-pay | Admitting: Student in an Organized Health Care Education/Training Program

## 2021-05-25 ENCOUNTER — Other Ambulatory Visit: Payer: Self-pay

## 2021-05-25 ENCOUNTER — Inpatient Hospital Stay
Admission: RE | Admit: 2021-05-25 | Discharge: 2021-05-25 | Disposition: A | Discharge status: Home / Self Care | Payer: Managed Care, Other (non HMO) | Source: Ambulatory Visit | Attending: Student in an Organized Health Care Education/Training Program | Admitting: Student in an Organized Health Care Education/Training Program

## 2021-05-25 ENCOUNTER — Telehealth (INDEPENDENT_AMBULATORY_CARE_PROVIDER_SITE_OTHER): Payer: Self-pay | Admitting: Student in an Organized Health Care Education/Training Program

## 2021-05-25 ENCOUNTER — Ambulatory Visit (HOSPITAL_COMMUNITY): Payer: Managed Care, Other (non HMO) | Admitting: Anesthesiology

## 2021-05-25 ENCOUNTER — Other Ambulatory Visit (HOSPITAL_COMMUNITY): Payer: Managed Care, Other (non HMO)

## 2021-05-25 ENCOUNTER — Ambulatory Visit (HOSPITAL_BASED_OUTPATIENT_CLINIC_OR_DEPARTMENT_OTHER): Payer: Managed Care, Other (non HMO) | Admitting: Anesthesiology

## 2021-05-25 ENCOUNTER — Encounter (HOSPITAL_COMMUNITY)
Admission: RE | Disposition: A | Discharge status: Home / Self Care | Payer: Self-pay | Source: Ambulatory Visit | Attending: Student in an Organized Health Care Education/Training Program

## 2021-05-25 DIAGNOSIS — Z302 Encounter for sterilization: Secondary | ICD-10-CM

## 2021-05-25 DIAGNOSIS — Z9852 Vasectomy status: Secondary | ICD-10-CM

## 2021-05-25 HISTORY — DX: Dependence on other enabling machines and devices: Z99.89

## 2021-05-25 HISTORY — DX: Sleep apnea, unspecified: G47.30

## 2021-05-25 SURGERY — VASECTOMY
Anesthesia: Monitor Anesthesia Care | Wound class: Clean Contaminated Wounds-The respiratory, GI, Genital, or urinary

## 2021-05-25 MED ORDER — SODIUM CHLORIDE 0.9 % INTRAVENOUS SOLUTION
2.0000 g | Freq: Once | INTRAVENOUS | Status: AC
Start: 2021-05-25 — End: 2021-05-25
  Administered 2021-05-25: 2 g via INTRAVENOUS
  Filled 2021-05-25 (×2): qty 20

## 2021-05-25 MED ORDER — HYDROCODONE 5 MG-ACETAMINOPHEN 325 MG TABLET
1.0000 | ORAL_TABLET | ORAL | 0 refills | Status: AC | PRN
Start: 2021-05-25 — End: 2021-05-28

## 2021-05-25 MED ORDER — HYDROCODONE 10 MG-ACETAMINOPHEN 325 MG TABLET
1.0000 | ORAL_TABLET | Freq: Four times a day (QID) | ORAL | 0 refills | Status: AC | PRN
Start: 2021-05-25 — End: 2021-05-28

## 2021-05-25 MED ORDER — LACTATED RINGERS INTRAVENOUS SOLUTION
INTRAVENOUS | Status: DC
Start: 2021-05-25 — End: 2021-05-25
  Administered 2021-05-25: 0 via INTRAVENOUS

## 2021-05-25 MED ORDER — LIDOCAINE HCL 10 MG/ML (1 %) INJECTION SOLUTION
INTRAMUSCULAR | Status: AC
Start: 2021-05-25 — End: 2021-05-25
  Filled 2021-05-25: qty 50

## 2021-05-25 MED ORDER — PROPOFOL 10 MG/ML IV BOLUS
INJECTION | Freq: Once | INTRAVENOUS | Status: DC | PRN
Start: 2021-05-25 — End: 2021-05-25
  Administered 2021-05-25: 70 mg via INTRAVENOUS

## 2021-05-25 MED ORDER — PROPOFOL 10 MG/ML INTRAVENOUS EMULSION
INTRAVENOUS | Status: DC | PRN
Start: 2021-05-25 — End: 2021-05-25
  Administered 2021-05-25: 100 ug/kg/min via INTRAVENOUS
  Administered 2021-05-25: 140 ug/kg/min via INTRAVENOUS
  Administered 2021-05-25: 50 ug/kg/min via INTRAVENOUS
  Administered 2021-05-25: 0 ug/kg/min via INTRAVENOUS

## 2021-05-25 MED ORDER — HYDROCODONE 5 MG-ACETAMINOPHEN 325 MG TABLET
1.0000 | ORAL_TABLET | ORAL | Status: AC
Start: 2021-05-25 — End: 2021-05-25
  Administered 2021-05-25: 1 via ORAL
  Filled 2021-05-25: qty 1

## 2021-05-25 MED ORDER — BACITRACIN ZINC 500 UNIT/GRAM TOPICAL OINTMENT
TOPICAL_OINTMENT | CUTANEOUS | Status: AC
Start: 2021-05-25 — End: 2021-05-25
  Filled 2021-05-25: qty 28.4

## 2021-05-25 MED ORDER — ONDANSETRON HCL (PF) 4 MG/2 ML INJECTION SOLUTION
Freq: Once | INTRAMUSCULAR | Status: DC | PRN
Start: 2021-05-25 — End: 2021-05-25
  Administered 2021-05-25: 4 mg via INTRAVENOUS

## 2021-05-25 MED ORDER — LIDOCAINE HCL 10 MG/ML (1 %) INJECTION SOLUTION
Freq: Once | INTRAMUSCULAR | Status: DC | PRN
Start: 2021-05-25 — End: 2021-05-25
  Administered 2021-05-25: 3 mL via INTRADERMAL

## 2021-05-25 MED ORDER — MIDAZOLAM (PF) 1 MG/ML INJECTION SOLUTION
Freq: Once | INTRAMUSCULAR | Status: DC | PRN
Start: 2021-05-25 — End: 2021-05-25
  Administered 2021-05-25 (×2): 2 mg via INTRAVENOUS

## 2021-05-25 MED ORDER — FENTANYL (PF) 50 MCG/ML INJECTION SOLUTION
Freq: Once | INTRAMUSCULAR | Status: DC | PRN
Start: 2021-05-25 — End: 2021-05-25
  Administered 2021-05-25 (×2): 100 ug via INTRAVENOUS

## 2021-05-25 SURGICAL SUPPLY — 22 items
BANDAGE 3X.75IN SHR NADH PAD PLASTIC ADH STRL LF (WOUND CARE SUPPLY) IMPLANT
BANDAGE 3X.75IN SHR NADH PAD P_LASTIC ADH STRL LF (WOUND CARE/ENTEROSTOMAL SUPPLY)
CONV USE 110626 - SUPPORT ATHL WHT LRG NONST LF  REUSE (WOUND CARE SUPPLY) ×1 IMPLANT
CONV USE 400323 - SUPPORT ATHL WHT XL NONST LF  REUSE (WOUND CARE SUPPLY) IMPLANT
CONV USE ITEM 322012 - GLOVE SURG 8 LTX PF SMOOTH BEAD CUF STRL CRM 11.4IN ENCR NATURAL RUB CURVE (GLOVES AND ACCESSORIES) ×1 IMPLANT
DCNTR FLD TRNSF DEV (IV TUBING & ACCESSORIES) ×1
DCNTR FLUID TRANSF DEV DISP STRL LF (IV TUBING & ACCESSORIES) ×1 IMPLANT
DERMABOND DNX12 ADVANCED DERMABOND DNX12 OR USE ONLY (MEDICATIONS/SOLUTIONS) ×2 IMPLANT
DRAPE ABS FENESTRATE ADH 121X102X77IN ABDOMINAL 12IN 13IN PRXM LF  STRL DISP SURG SMS 20X36IN (DRAPE/PACKS/SHEETS/OR TOWEL) ×1 IMPLANT
DRAPE ABS FENESTRATE ADH 121X1_02X77IN ABDOMINAL 12IN 13IN (DRAPE/PACKS/SHEETS/OR TOWEL) ×1
ELECTRODE ESURG NEEDLE 1IN STRL DISP LF (CUTTING ELEMENTS) ×1
ELECTRODE ESURG NEEDLE 1IN STRL DISP LF (SURGICAL CUTTING SUPPLIES) ×1 IMPLANT
GLOVE SURG 8 LTX PF SMOOTH BEAD CUF STRL CRM 11.4IN ENCR (GLOVES AND ACCESSORIES) ×1
GOWN AERO DYNJP2602 XL 30/CS GOWN AERO DYNJP2602 XL 30/CS (GOWN) ×4 IMPLANT
HANDPC SUCT PL STRL LF  DISP (MED SURG SUPPLIES) IMPLANT
INSTRUMENT SUCT POOLE K770 (MED SURG SUPPLIES)
PACK BASIC_DYNJP1020S 5/CS (CUSTOM TRAYS & PACK) ×1
PACK SURG SIRUS MAYO BASIC V TBL STAND CVR 90X50IN 53X24IN LF (CUSTOM TRAYS & PACK) ×1 IMPLANT
SUPPORT ATHL WHT 36-40IN CLNTX LRG SPORT LF (WOUND CARE/ENTEROSTOMAL SUPPLY) ×1
SUPPORT ATHL WHT 40-46IN CLNTX XL NONBIND LEG STRAP ELAS WB (WOUND CARE/ENTEROSTOMAL SUPPLY)
SUTURE 3-0 SH VICRYL 27IN VIOL BRD COAT ABS (SUTURE/WOUND CLOSURE) ×1 IMPLANT
SUTURE 3-0 SH VICRYL 27IN VIOL_BRD COAT ABS (SUTURE/WOUND CLOSURE) ×1

## 2021-05-25 NOTE — Telephone Encounter (Signed)
PT STATES THE STRENGTH ISN'T AVAILABLE THEY NEED IT TO BE IN A 10

## 2021-05-25 NOTE — Anesthesia Transfer of Care (Signed)
ANESTHESIA TRANSFER OF CARE   Eddie Wiggins is a 31 y.o. ,male, Weight: 88.5 kg (195 lb)   had Procedure(s):  VASECTOMY  performed  05/25/21   Primary Service: Aviva Signs, *    Past Medical History:   Diagnosis Date   . CPAP (continuous positive airway pressure) dependence    . Diabetes mellitus, type 2 (CMS HCC)    . Sleep apnea    . Wears glasses       Allergy History as of 05/25/21     OXYCODONE-ACETAMINOPHEN       Noted Status Severity Type Reaction    05/15/21 1212 Ardeth Sportsman, RN 05/15/21 Active Low  Nausea/ Vomiting              I completed my transfer of care / handoff to the receiving personnel during which we discussed:  Access, Airway, All key/critical aspects of case discussed, Analgesia, Antibiotics, Expectation of post procedure, Fluids/Product, Gave opportunity for questions and acknowledgement of understanding, Labs and PMHx    Post Location: Phase II                                       Report given to: Hinda Kehr, RN                           Last OR Temp: Temperature: 36.5 C (97.7 F)  ABG:  POTASSIUM   Date Value Ref Range Status   05/08/2021 4.0 3.3 - 5.1 mmol/L Final   05/08/2021 4.1 3.3 - 5.1 mmol/L Final     KETONES   Date Value Ref Range Status   05/08/2021 Negative Negative mg/dL Final     CALCIUM   Date Value Ref Range Status   05/08/2021 9.2 8.0 - 10.6 mg/dL Final   50/35/4656 9.3 8.0 - 10.6 mg/dL Final     Calculated P Axis   Date Value Ref Range Status   05/08/2021 17 degrees Final     Calculated R Axis   Date Value Ref Range Status   05/08/2021 -6 degrees Final     Calculated T Axis   Date Value Ref Range Status   05/08/2021 32 degrees Final     Airway:* No LDAs found *  Blood pressure 124/84, pulse 81, temperature 36.5 C (97.7 F), resp. rate 16, height 1.778 m (5\' 10" ), weight 88.5 kg (195 lb), SpO2 96 %.

## 2021-05-25 NOTE — Anesthesia Postprocedure Evaluation (Signed)
Anesthesia Post Op Evaluation    Patient: Eddie Wiggins  Procedure(s):  VASECTOMY    Last Vitals:Temperature: 36.5 C (97.7 F) (05/25/21 1150)  Heart Rate: 81 (05/25/21 1150)  BP (Non-Invasive): 130/82 (05/25/21 1311)  Respiratory Rate: 16 (05/25/21 1150)  SpO2: 96 % (05/25/21 1311)    No notable events documented.    Patient is sufficiently recovered from the effects of anesthesia to participate in the evaluation and has returned to their pre-procedure level.  Patient location during evaluation: bedside       Patient participation: complete - patient participated  Level of consciousness: awake and alert and responsive to verbal stimuli    Pain management: adequate  Airway patency: patent    Anesthetic complications: no  Cardiovascular status: acceptable  Respiratory status: acceptable  Hydration status: acceptable  Patient post-procedure temperature: Pt Normothermic   PONV Status: Absent

## 2021-05-25 NOTE — Progress Notes (Signed)
[facility]  Department of Surgery  Discharge Day Note    Delmos Velaquez  Date of service: 05/25/2021  Date of Admission:  05/25/2021  Hospital Day:  LOS: 0 days     Examination at discharge:  Vital Signs: Temperature: 36.5 C (97.7 F) (05/25/21 1150)  Heart Rate: 81 (05/25/21 1150)  BP (Non-Invasive): 124/84 (05/25/21 1150)  Respiratory Rate: 16 (05/25/21 1150)  SpO2: 96 % (05/25/21 0913)  General: No distress    Assessment:   31 y.o. male s/p vasectomy    Disposition/Plan : The patient will be discharged in stable condition. He was advised to keep the testicles elevated at all times. He should avoid any strenuous for the next week. He was also advised to use barrier contraception for the next 8 weeks until the sperm count is zero. He was informed of the various potential complications from the procedure which would include pain, hematoma, recanulation, hydrocele, testicular atrophy, and persistent fertility.       Mcarthur Rossetti. Mellissa Kohut, MD  Assistant Professor  [facility] Department of Urology  Pager [phone]  05/25/2021, 11:55

## 2021-05-25 NOTE — H&P (Signed)
[facility]  Department of Urology   Pre-operative History and Physical    Eddie Wiggins  [id number]  04-26-1990    CC: Desire for elective sterilization     HPI: Eddie Wiggins is a 32 y.o. male with a desire for elective sterilization who presents for vasectomy. The patient's was last seen in clinic on 04/17/21. No new issues. He has 2 children and desires no more. He denies any history of bleeding problems. Has not taken any blood thinners including ASA, Plavix, and Coumadin in the past 7-10 days.     PMHx:   Past Medical History:   Diagnosis Date   . CPAP (continuous positive airway pressure) dependence    . Diabetes mellitus, type 2 (CMS HCC)    . Sleep apnea    . Wears glasses           PSHx:   Past Surgical History:   Procedure Laterality Date   . HX WISDOM TEETH EXTRACTION           Family Hx: Marland Kitchen  Family Medical History:     Problem Relation (Age of Onset)    Diabetes Father    Stroke Mother          Social Hx:   Social History     Socioeconomic History   . Marital status: Married     Spouse name: Not on file   . Number of children: Not on file   . Years of education: Not on file   . Highest education level: Not on file   Occupational History   . Not on file   Tobacco Use   . Smoking status: Former     Packs/day: 0.50     Types: Cigarettes     Start date: 2010     Quit date: 2011     Years since quitting: 12.2   . Smokeless tobacco: Current     Types: Snuff   Vaping Use   . Vaping Use: Never used   Substance and Sexual Activity   . Alcohol use: Not Currently   . Drug use: Never   . Sexual activity: Not on file   Other Topics Concern   . Ability to Walk 1 Flight of Steps without SOB/CP Yes   . Routine Exercise Not Asked   . Ability to Walk 2 Flight of Steps without SOB/CP Yes   . Unable to Ambulate Not Asked   . Total Care Not Asked   . Ability To Do Own ADL's Yes   . Uses Walker Not Asked   . Other Activity Level Not Asked   . Uses Cane Not Asked   Social History Narrative   . Not on file      Social Determinants of Health     Financial Resource Strain: Low Risk    . SDOH Financial: No   Transportation Needs: Low Risk    . SDOH Transportation: No   Social Connections: Medium Risk   . SDOH Social Isolation: 1 or 2 times a week   Intimate Partner Violence: High Risk   . SDOH Domestic Violence: No   Housing Stability: Low Risk    . SDOH Housing Situation: I have housing.   Marland Kitchen SDOH Housing Worry: No     Medications:   No current outpatient medications on file.     Allergies:   Allergies   Allergen Reactions   . Percocet [Oxycodone-Acetaminophen] Nausea/ Vomiting       ROS:  All 10 systems were reviewed.  There were pertinent positives in: Genital urinary system  There are no new changes to the prior reviewed review of systems.    Physical Exam:  BP 134/87   Pulse 89   Temp 36.2 C (97.2 F)   Resp 18   Ht 1.778 m (5\' 10" )   Wt 88.5 kg (195 lb)   SpO2 96%   BMI 27.98 kg/m       General - Alert/oriented; NAD  Neck - Supple, trachea midline  Lungs - Respirations are unlabored, good inspiratory effort  Abdomen - Non-distended   Musculoskeletal - FROM in all 4 extremities  Neurological - CN II-XII Grossly intact  GU system - Deferred. Plan to examine in OR    Assessment:  31 y.o. male with a desire for elective sterilization    Plan:  The risks and complications of vasectomy were extensively explained. These include the possibility of bleeding, swelling, ecchymosis, scrotal hematoma formation, infection, acute and chronic pain (post vasectomy pain syndrome), and the remote chance of spontaneous reversal, quoted at approximately 1:2000. Possible long-term complications including heart disease, prostate cancer, and other illnesses were also mentioned, as well as the numerous long-term large volume studies, indicating a minimal risk of increase of any other disease process. The absolute necessity for continued contraception following vasectomy was stressed, and the need for post-vasectomy semen analysis at  approximately eight weeks (or 20 ejaculates) with no sperm in the sample prior to unprotected intercourse was emphasized.   To OR for vasectomy  Consent obtained  The patient understands the risks of the procedure, which include bleeding, infection, recurrence, damage to the surrounding structures, failure to correct pathology, loss of sensation, and the risks of anesthesia (heart attack, stroke, death, etc.)     All questions of patient and family answered    Lavon Paganini. Christoper Fabian, MD  Assistant Professor  [facility] Department of Urology  Pager [phone]  05/25/2021, 10:21

## 2021-05-25 NOTE — OR PostOp (Signed)
Scrotal  Dressing  Dry and  Intact  , scrotal  Support on , ice  Bag  Applied   Dr  Christoper Fabian   In to see and  Talk with  Patient

## 2021-05-25 NOTE — OR Surgeon (Signed)
[facility] SUMMARY    Patient Name: Eddie Wiggins Number: [id number]  Date of Service: 05/25/2021  Date of Birth: [birth year]    Pre-Operative Diagnosis: Desire for elective sterlization   Post-Operative Diagnosis: Desire for elective sterlization    Procedure(s)/Description:  Bilateral vasectomy    Surgeons: Jonnie Kind, MD    Anesthesia Type: MAC, 0.25% Marcaine  Estimated Blood Loss:  Minimal  Blood Given: None  Fluids Given: Per anesthesia report  Complications:  None  Tubes: None  Drains: None  Specimens/ Cultures: Bilateral segments of vas deferens           Indications for Procedure:  Patient is a pleasant 31 y.o. male with a desire to undergo elective sterlization. The risks and complications of vasectomy were extensively explained. These include the possibility of bleeding, swelling, ecchymosis, scrotal hematoma formation, infection, acute and chronic pain (post vasectomy pain syndrome), and the remote chance of spontaneous reversal, quoted at approximately 1:2000. Possible long-term complications including heart disease, prostate cancer, and other illnesses were also mentioned, as well as the numerous long-term large volume studies, indicating a minimal risk of increase of any other disease process. The absolute necessity for continued contraception following vasectomy was stressed, and the need for post-vasectomy semen analysis at approximately eight weeks (or 20 ejaculates) with no sperm in the sample prior to unprotected intercourse was emphasized.     Operative Findings:  Bilateral segments of vas deferens removed    Description of Procedure:  After informed consent was obtained the patient was brought back to the operating room and placed in the supine position. MAC was induced by our anesthesia colleagues. His genitalia were prepped and draped in the standard sterile fashion. Attention was turned to the left hemiscrotum where the vas  deferens was isolated between the fingers. 1 cc of 0.25% Marcaine was injected to make a skin wheal over the vas. The vas was grasped and vas clamp and the needle nosed hemostats were used to make an incision over the vas. Using sharp and blunt dissection the vas was isolated and a 1.5 cm segment was removed. Both the proximal and distal end was suture ligated with a 3-0 chromic tie. Each end was cauterized twice. Once with the needle tip intraluminal, and once at the cut distal surface. Hemostasis was maintained. The incision was closed with a 3-0 chromic. A similar procedure was carried out on the contralateral side. The procedure was then terminated. He tolerated the procedure well without any complications.    Disposition:  The patient will be discharged in stable condition. He was advised to keep the testicles elevated at all times. He should avoid any strenuous for the next week. He was also advised to use barrier contraception for the next 8 weeks until the sperm count is zero. He was informed of the various potential complications from the procedure which would include pain, hematoma, recanulation, hydrocele, testicular atrophy, and persistent fertility.       Lavon Paganini. Christoper Fabian, MD  Assistant Professor  [facility] Department of Urology  Pager [phone]  05/25/2021, 11:54

## 2021-05-25 NOTE — Telephone Encounter (Signed)
SCRIPT REFILL - PT WAS WRITTEN PAIN PILL BUT PHARM IS OUT - PLEASE CALL TO REORDER - PLEASE CALL PT -

## 2021-05-25 NOTE — Anesthesia Preprocedure Evaluation (Addendum)
ANESTHESIA PRE-OP EVALUATION  Planned Procedure: VASECTOMY  Review of Systems         patient summary reviewed          Pulmonary   sleep apnea,   Cardiovascular    No peripheral edema,        GI/Hepatic/Renal           Endo/Other         Neuro/Psych/MS        Cancer                      Physical Assessment      Airway       Mallampati: II    TM distance: <3 FB    Neck ROM: full  Mouth Opening: good.            Dental       Dentition intact             Pulmonary    Breath sounds clear to auscultation  (-) no rhonchi, no decreased breath sounds, no wheezes, no rales and no stridor     Cardiovascular    Rhythm: regular  Rate: Normal  (-) no friction rub, carotid bruit is not present, no peripheral edema and no murmur     Other findings            Plan  ASA 2     Planned anesthesia type: MAC                     Intravenous induction     Anesthesia issues/risks discussed are: PONV.  Anesthetic plan and risks discussed with patient  Signed consent obtained            Patient's NPO status is appropriate for Anesthesia.           Plan discussed with CRNA.

## 2021-05-25 NOTE — Telephone Encounter (Signed)
PT WIFE CALLING SAYS THE VICODIN IS ON BACK ORDER AND THEY NEED SOMETHING DIFFERENT BUT He CANT TAKE TAKE PERCOCET

## 2021-05-26 ENCOUNTER — Telehealth (INDEPENDENT_AMBULATORY_CARE_PROVIDER_SITE_OTHER): Payer: Self-pay | Admitting: Student in an Organized Health Care Education/Training Program

## 2021-05-26 LAB — SURGICAL PATHOLOGY SPECIMEN

## 2021-05-26 NOTE — Telephone Encounter (Signed)
Per pharmacy fax:    Hydrocodone 5-325mg  has been on back order for 2 months    Pharmacy requesting alternative RX    Chart to provider

## 2021-07-11 ENCOUNTER — Other Ambulatory Visit: Payer: Self-pay

## 2021-07-11 ENCOUNTER — Other Ambulatory Visit: Payer: Managed Care, Other (non HMO) | Attending: Student in an Organized Health Care Education/Training Program

## 2021-07-11 DIAGNOSIS — Z9852 Vasectomy status: Secondary | ICD-10-CM | POA: Insufficient documentation

## 2021-07-11 LAB — SEMEN ANALYSIS, POST VAS

## 2022-12-04 IMAGING — MR MRI KNEE LT W/O CONTRAST
5 of 7 series · 37 of 40 positions shown · non-contrast
Comparison: none

TECHNIQUE: Proton density and T2-weighted sagittal images of the knee were taken.  Proton density coronal and axial images were also obtained.

[Series 3: PD fat-sat · axial · 3.0mm · 0.47mm/px · z∈[-124,+24]mm · 9 of 38 slices shown (1 of 3)]
[im 1/38]
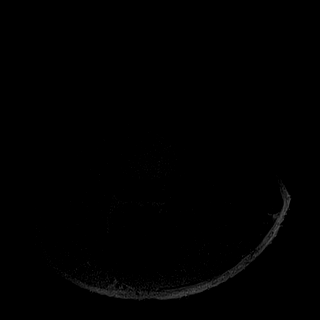
[im 5/38]
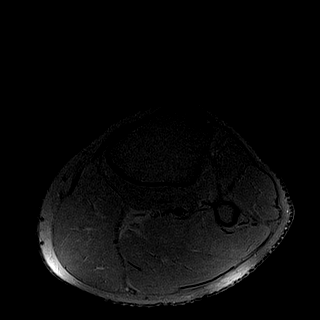
[im 10/38]
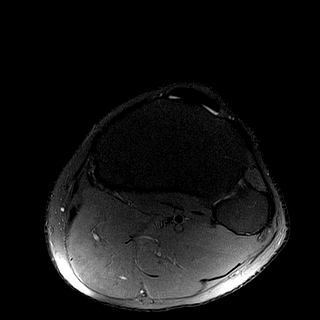
[im 14/38]
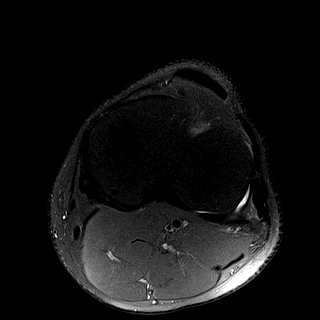
[im 19/38]
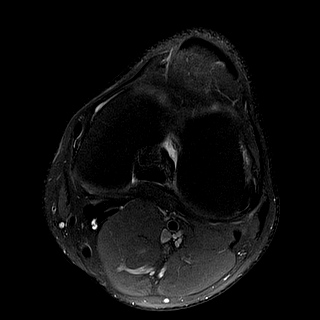
[im 24/38]
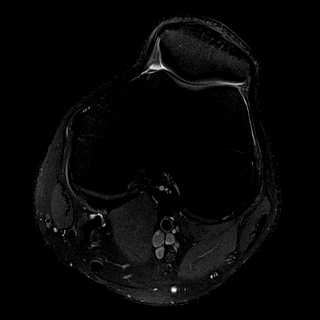
[im 28/38]
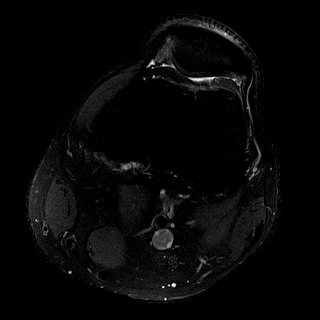
[im 33/38]
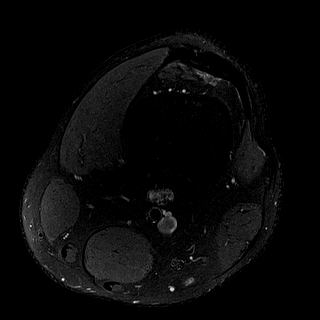
[im 38/38]
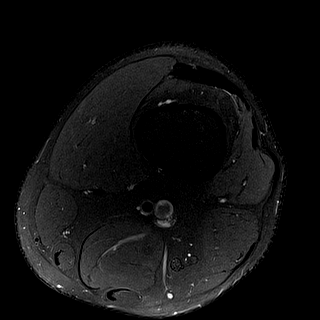

[Series 4: PD fat-sat · sagittal · 4.0mm · 0.47mm/px · 7 of 26 slices shown (2 of 3)]
[im 1/26]
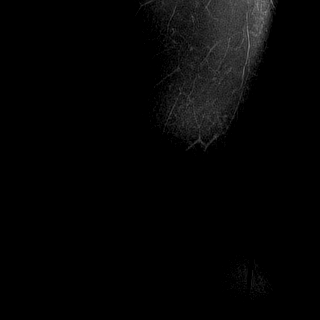
[im 5/26]
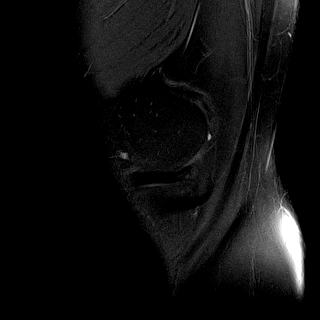
[im 9/26]
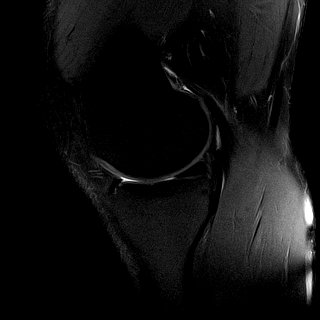
[im 13/26]
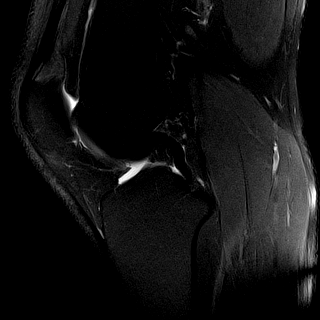
[im 17/26]
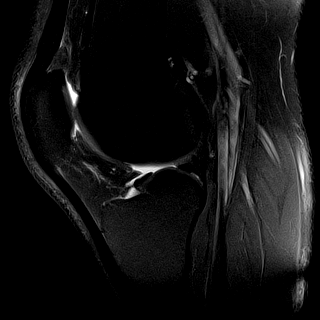
[im 21/26]
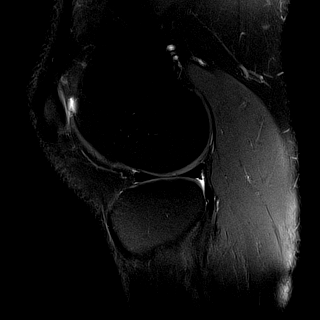
[im 26/26]
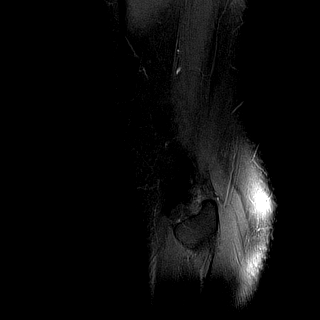

[Series 5: T2 · sagittal · 4.0mm · 0.47mm/px · 7 of 26 slices shown]
[im 1/26]
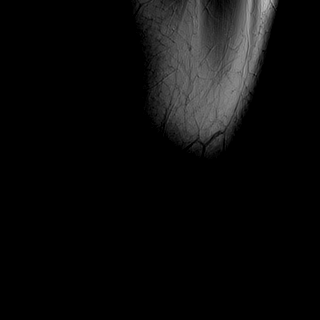
[im 5/26]
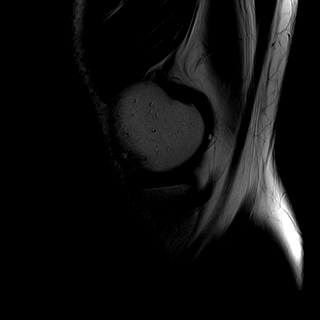
[im 9/26]
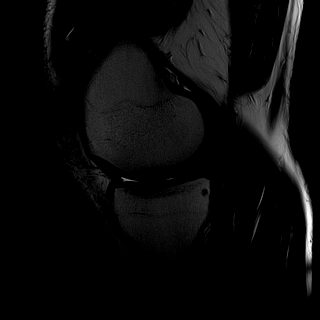
[im 13/26]
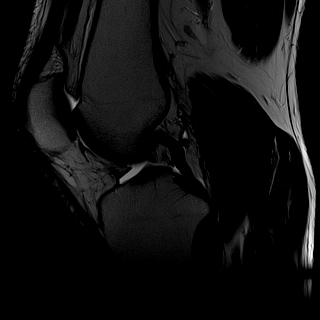
[im 17/26]
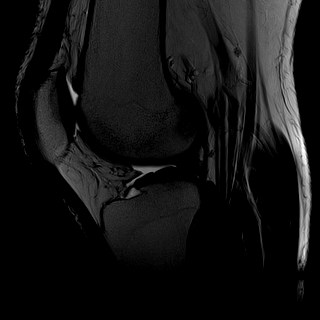
[im 21/26]
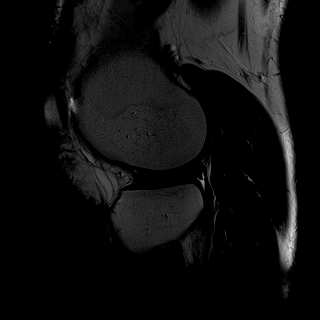
[im 26/26]
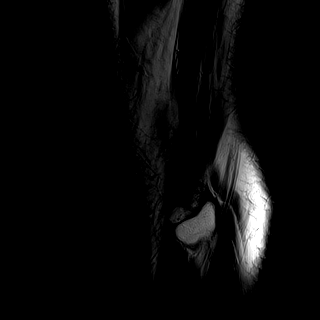

[Series 6: PD · sagittal · 4.0mm · 0.59mm/px · 7 of 26 slices shown]
[im 1/26]
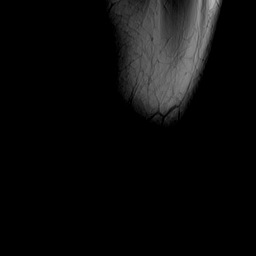
[im 5/26]
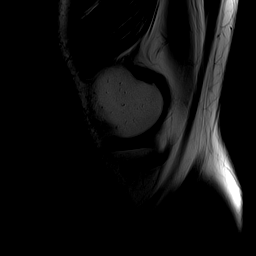
[im 9/26]
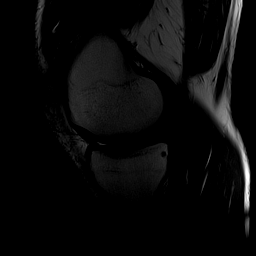
[im 13/26]
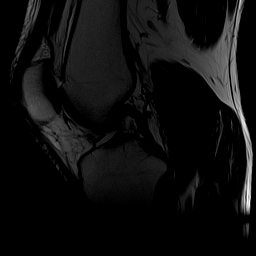
[im 17/26]
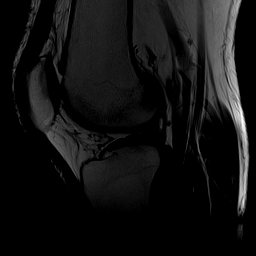
[im 21/26]
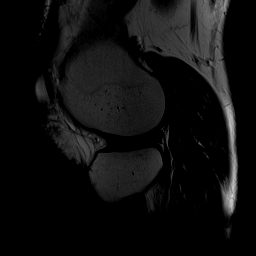
[im 26/26]
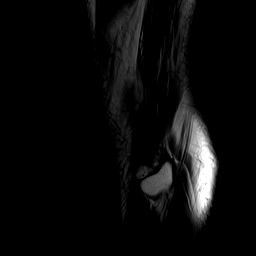

[Series 7: PD fat-sat · coronal · 4.0mm · 0.47mm/px · 7 of 26 slices shown (3 of 3)]
[im 1/26]
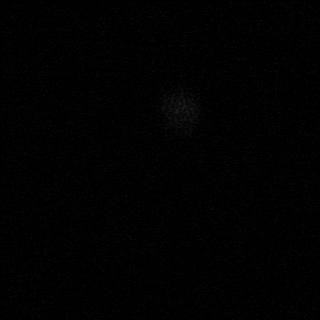
[im 5/26]
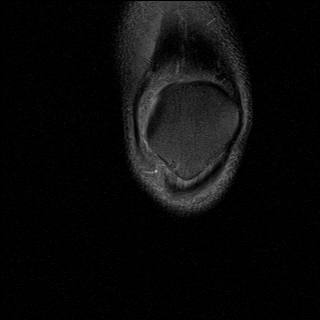
[im 9/26]
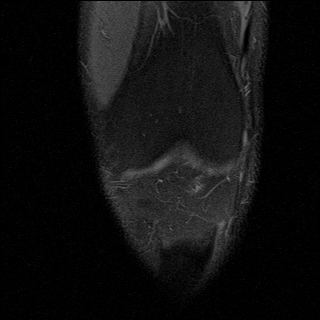
[im 13/26]
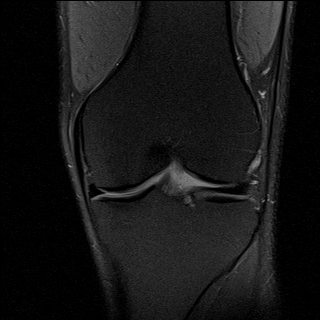
[im 17/26]
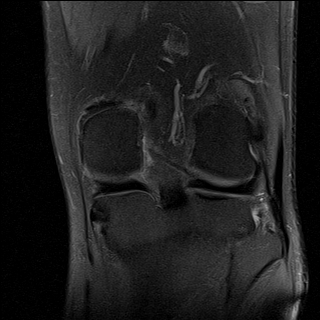
[im 21/26]
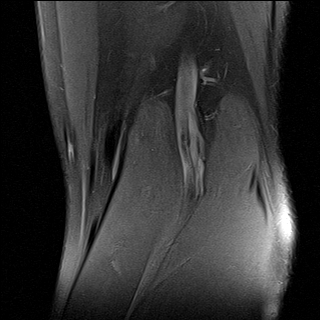
[im 26/26]
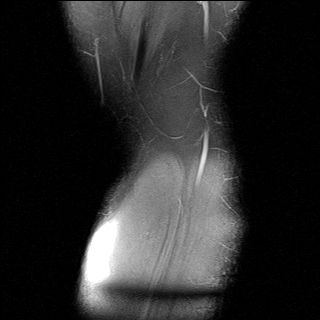

[37 of 40 positions shown; findings below may reference images not displayed]

FINDINGS: Alignment is normal.  

There is no fracture.  Bone marrow signal is normal.  No evidence of stress injury or stress reaction.  

There is minimal joint fluid with no effusion.  

No meniscus tear, medially or laterally, identified.  

Anterior and posterior cruciate ligaments are normal.  

Collateral ligaments are normal.  

The distal quadriceps tendon and the patellar tendon are normal.  

Muscles and tendons in the popliteal space appear normal.
IMPRESSION: Negative MRI of the left knee.  No abnormality identified.

## 2023-02-12 ENCOUNTER — Inpatient Hospital Stay
Admission: RE | Admit: 2023-02-12 | Discharge: 2023-02-12 | Disposition: A | Discharge status: Home / Self Care | Payer: Managed Care, Other (non HMO) | Source: Ambulatory Visit | Attending: MULTISPECIALTY CLINIC OR GROUP PRACTICE | Admitting: MULTISPECIALTY CLINIC OR GROUP PRACTICE

## 2023-02-12 ENCOUNTER — Other Ambulatory Visit (HOSPITAL_COMMUNITY): Payer: Self-pay | Admitting: MULTISPECIALTY CLINIC OR GROUP PRACTICE

## 2023-02-12 ENCOUNTER — Other Ambulatory Visit: Payer: Self-pay

## 2023-02-12 DIAGNOSIS — R609 Edema, unspecified: Secondary | ICD-10-CM
